# Patient Record
Sex: Male | Born: 1985 | Race: Black or African American | Hispanic: No | Marital: Single | Smoking: Never smoker
Health system: Southern US, Community
[De-identification: ages and names within clinical notes are randomized; demographics above are authoritative.]

---

## 2013-07-10 ENCOUNTER — Emergency Department (HOSPITAL_COMMUNITY): Payer: Self-pay

## 2013-07-10 ENCOUNTER — Emergency Department (HOSPITAL_COMMUNITY)
Admission: EM | Admit: 2013-07-10 | Discharge: 2013-07-10 | Disposition: A | Discharge status: Home / Self Care | Payer: Self-pay | Attending: Emergency Medicine | Admitting: Emergency Medicine

## 2013-07-10 ENCOUNTER — Encounter (HOSPITAL_COMMUNITY): Payer: Self-pay | Admitting: Emergency Medicine

## 2013-07-10 DIAGNOSIS — R9431 Abnormal electrocardiogram [ECG] [EKG]: Secondary | ICD-10-CM | POA: Insufficient documentation

## 2013-07-10 DIAGNOSIS — R0789 Other chest pain: Secondary | ICD-10-CM | POA: Insufficient documentation

## 2013-07-10 LAB — CBC
HCT: 45.4 % (ref 39.0–52.0)
HEMOGLOBIN: 16.5 g/dL (ref 13.0–17.0)
MCH: 29 pg (ref 26.0–34.0)
MCHC: 36.3 g/dL — AB (ref 30.0–36.0)
MCV: 79.8 fL (ref 78.0–100.0)
Platelets: 214 10*3/uL (ref 150–400)
RBC: 5.69 MIL/uL (ref 4.22–5.81)
RDW: 12.8 % (ref 11.5–15.5)
WBC: 10.5 10*3/uL (ref 4.0–10.5)

## 2013-07-10 LAB — BASIC METABOLIC PANEL
BUN: 13 mg/dL (ref 6–23)
CO2: 20 mEq/L (ref 19–32)
Calcium: 9.8 mg/dL (ref 8.4–10.5)
Chloride: 101 mEq/L (ref 96–112)
Creatinine, Ser: 1.16 mg/dL (ref 0.50–1.35)
GFR, EST NON AFRICAN AMERICAN: 86 mL/min — AB (ref >90)
Glucose, Bld: 101 mg/dL — ABNORMAL HIGH (ref 70–99)
POTASSIUM: 3.6 meq/L — AB (ref 3.7–5.3)
SODIUM: 141 meq/L (ref 137–147)

## 2013-07-10 LAB — I-STAT TROPONIN, ED: TROPONIN I, POC: 0.01 ng/mL (ref 0.00–0.08)

## 2013-07-10 LAB — D-DIMER, QUANTITATIVE (NOT AT ARMC)

## 2013-07-10 MED ORDER — ASPIRIN 81 MG PO CHEW
324.0000 mg | CHEWABLE_TABLET | Freq: Once | ORAL | Status: DC
  Filled 2013-07-10: qty 4

## 2013-07-10 NOTE — ED Provider Notes (Signed)
CSN: [id number]     Arrival date & time 07/10/13  1804 History   First MD Initiated Contact with Patient 07/10/13 2119     Chief Complaint  Patient presents with  . Chest Pain   HPI  History provided by the patient. Patient is a 28 year old male with no significant PMH presenting with episode of upper chest tightness. Patient states he began having some upper left-sided chest tightness earlier at work. He especially felt tightness when he took a deep breath. His symptoms were lasting for approximately one hour before they spontaneously resolved. He was not performing any strenuous activity at the time. He does report being very hot and sweating a lot due to the work conditions. Patient was a previous smoker but states he has quit recently. No past history of asthma or other lung conditions. He denies having any associated cough or shortness of breath. Pain did not radiate. He was not having any nausea. He denies any heartburn symptoms or belching. Denies having similar symptoms previously. He has no other significant medical history. No significant family history for early cardiac death or heart disease.   History reviewed. No pertinent past medical history. No past surgical history on file. No family history on file. History  Substance Use Topics  . Smoking status: Not on file  . Smokeless tobacco: Not on file  . Alcohol Use: Not on file    Review of Systems  Constitutional: Negative for fever and chills.  Respiratory: Negative for cough and shortness of breath.   Cardiovascular: Positive for chest pain. Negative for palpitations.  Gastrointestinal: Negative for nausea, vomiting and abdominal pain.  All other systems reviewed and are negative.     Allergies  Review of patient's allergies indicates no known allergies.  Home Medications  No current outpatient prescriptions on file. BP 151/79  Pulse 84  Temp(Src) 99.6 F (37.6 C) (Oral)  Resp 16  SpO2 99% Physical Exam   Nursing note and vitals reviewed. Constitutional: He appears well-developed and well-nourished. No distress.  HENT:  Head: Normocephalic and atraumatic.  Mouth/Throat: Oropharynx is clear and moist.  Neck: Normal range of motion. Neck supple.  Cardiovascular: Normal rate and regular rhythm.   Pulmonary/Chest: Effort normal and breath sounds normal. No respiratory distress. He has no wheezes. He has no rales. He exhibits no tenderness.  Abdominal: Soft. There is no tenderness. There is no rebound and no guarding.  Musculoskeletal: Normal range of motion. He exhibits no edema and no tenderness.  Skin: Skin is warm. No erythema.  Psychiatric: His behavior is normal.    ED Course  Procedures   COORDINATION OF CARE:  Nursing notes reviewed. Vital signs reviewed. Initial pt interview and examination performed.   Filed Vitals:   07/10/13 1841 07/10/13 2110  BP: 136/84 151/79  Pulse: 86 84  Temp: 99.6 F (37.6 C)   TempSrc: Oral   Resp: 14 16  SpO2: 100% 99%    9:20 PM-patient seen and evaluated. Patient resting appears well no acute distress. Denies any pain or discomforts at this time.  10:15 PM patient and EKGs discussed and reviewed with attending physician, Dr. Jodi Mourning. Repeat EKG continues to show some high voltage probably related to patient's age and body this less likely to hypertrophy. There is slight repolarization abnormality and ST changes likely related to age. Patient without any significant risk factors for ACS. Lab testing and troponin unremarkable. At this time we will plan to refer patient to cardiology. He agrees with this plan.  Results for orders placed during the hospital encounter of 07/10/13  CBC      Result Value Ref Range   WBC 10.5  4.0 - 10.5 K/uL   RBC 5.69  4.22 - 5.81 MIL/uL   Hemoglobin 16.5  13.0 - 17.0 g/dL   HCT 16.145.4  09.639.0 - 04.552.0 %   MCV 79.8  78.0 - 100.0 fL   MCH 29.0  26.0 - 34.0 pg   MCHC 36.3 (*) 30.0 - 36.0 g/dL   RDW 40.912.8  81.111.5 -  91.415.5 %   Platelets 214  150 - 400 K/uL  BASIC METABOLIC PANEL      Result Value Ref Range   Sodium 141  137 - 147 mEq/L   Potassium 3.6 (*) 3.7 - 5.3 mEq/L   Chloride 101  96 - 112 mEq/L   CO2 20  19 - 32 mEq/L   Glucose, Bld 101 (*) 70 - 99 mg/dL   BUN 13  6 - 23 mg/dL   Creatinine, Ser 7.821.16  0.50 - 1.35 mg/dL   Calcium 9.8  8.4 - 95.610.5 mg/dL   GFR calc non Af Amer 86 (*) >90 mL/min   GFR calc Af Amer >90  >90 mL/min  D-DIMER, QUANTITATIVE      Result Value Ref Range   D-Dimer, Quant <0.27  0.00 - 0.48 ug/mL-FEU  I-STAT TROPOININ, ED      Result Value Ref Range   Troponin i, poc 0.01  0.00 - 0.08 ng/mL   Comment 3                   Imaging Review Dg Chest 2 View  07/10/2013   CLINICAL DATA:  Chest tightness  EXAM: CHEST  2 VIEW  COMPARISON:  None.  FINDINGS: The heart size and mediastinal contours are within normal limits. Both lungs are clear. The visualized skeletal structures are unremarkable.  IMPRESSION: No active cardiopulmonary disease.   Electronically Signed   By: Esperanza Heiraymond  Rubner M.D.   On: 07/10/2013 21:42     EKG Interpretation None      Date: 07/10/2013 18:35  Rate: 84  Rhythm: normal sinus rhythm and sinus arrhythmia  QRS Axis: normal  Intervals: QT prolonged  ST/T Wave abnormalities: nonspecific ST/T changes  Conduction Disutrbances:none  Narrative Interpretation: Possible LVH  Old EKG Reviewed: none available   Date: 07/10/2013  Rate: 81  Rhythm: normal sinus rhythm  QRS Axis: normal  Intervals: normal  ST/T Wave abnormalities: nonspecific ST/T changes  Conduction Disutrbances:none  Narrative Interpretation: Possible LVH  Old EKG Reviewed: No significant change        MDM   Final diagnoses:  Atypical chest pain  Abnormal EKG        Angus Sellereter S Jeryn Bertoni, PA-C 07/10/13 2341

## 2013-07-10 NOTE — Discharge Instructions (Signed)
Your testing and x-rays today did not show any signs for any emergent condition to explain your symptoms.  Your EKG of your heart did show some slight abnormalities.  Please follow up with a cardiology specialist for continued evaluation and treatment.     Chest Pain (Nonspecific) Chest pain has many causes. Your pain could be caused by something serious, such as a heart attack or a blood clot in the lungs. It could also be caused by something less serious, such as a chest bruise or a virus. Follow up with your doctor. More lab tests or other studies may be needed to find the cause of your pain. Most of the time, nonspecific chest pain will improve within 2 to 3 days of rest and mild pain medicine. HOME CARE  For chest bruises, you may put ice on the sore area for 15-20 minutes, 03-04 times a day. Do this only if it makes you feel better.  Put ice in a plastic bag.  Place a towel between the skin and the bag.  Rest for the next 2 to 3 days.  Go back to work if the pain improves.  See your doctor if the pain lasts longer than 1 to 2 weeks.  Only take medicine as told by your doctor.  Quit smoking if you smoke. GET HELP RIGHT AWAY IF:   There is more pain or pain that spreads to the arm, neck, jaw, back, or belly (abdomen).  You have shortness of breath.  You cough more than usual or cough up blood.  You have very bad back or belly pain, feel sick to your stomach (nauseous), or throw up (vomit).  You have very bad weakness.  You pass out (faint).  You have a fever. Any of these problems may be serious and may be an emergency. Do not wait to see if the problems will go away. Get medical help right away. Call your local emergency services 911 in [place] Do not drive yourself to the hospital. MAKE SURE YOU:   Understand these instructions.  Will watch this condition.  Will get help right away if you or your child is not doing well or gets worse. Document Released: 09/05/2007  Document Revised: 06/11/2011 Document Reviewed: 09/05/2007 [facility] Patient Information 2014 [place], [place].

## 2013-07-10 NOTE — ED Notes (Signed)
Discharge and follow up reviewed. Pt verbalized understanding.  

## 2013-07-10 NOTE — ED Notes (Signed)
Pt in c/o chest pain that started while working outside in the heat, states he had a tight feeling while taking a deep breath, symptoms resolved, denies pain at this time, no distress noted

## 2013-07-11 NOTE — ED Provider Notes (Signed)
Medical screening examination/treatment/procedure(s) were performed by non-physician practitioner and as supervising physician I was immediately available for consultation/collaboration.   EKG Interpretation None        Enid SkeensJoshua M Kylyn Mcdade, MD 07/11/13 [phone]

## 2014-12-18 IMAGING — CR DG CHEST 2V
2 series · 2 of 2 positions shown · non-contrast
Comparison: None.

CLINICAL DATA: Chest tightness

EXAM:
CHEST  2 VIEW

[w chest pa]
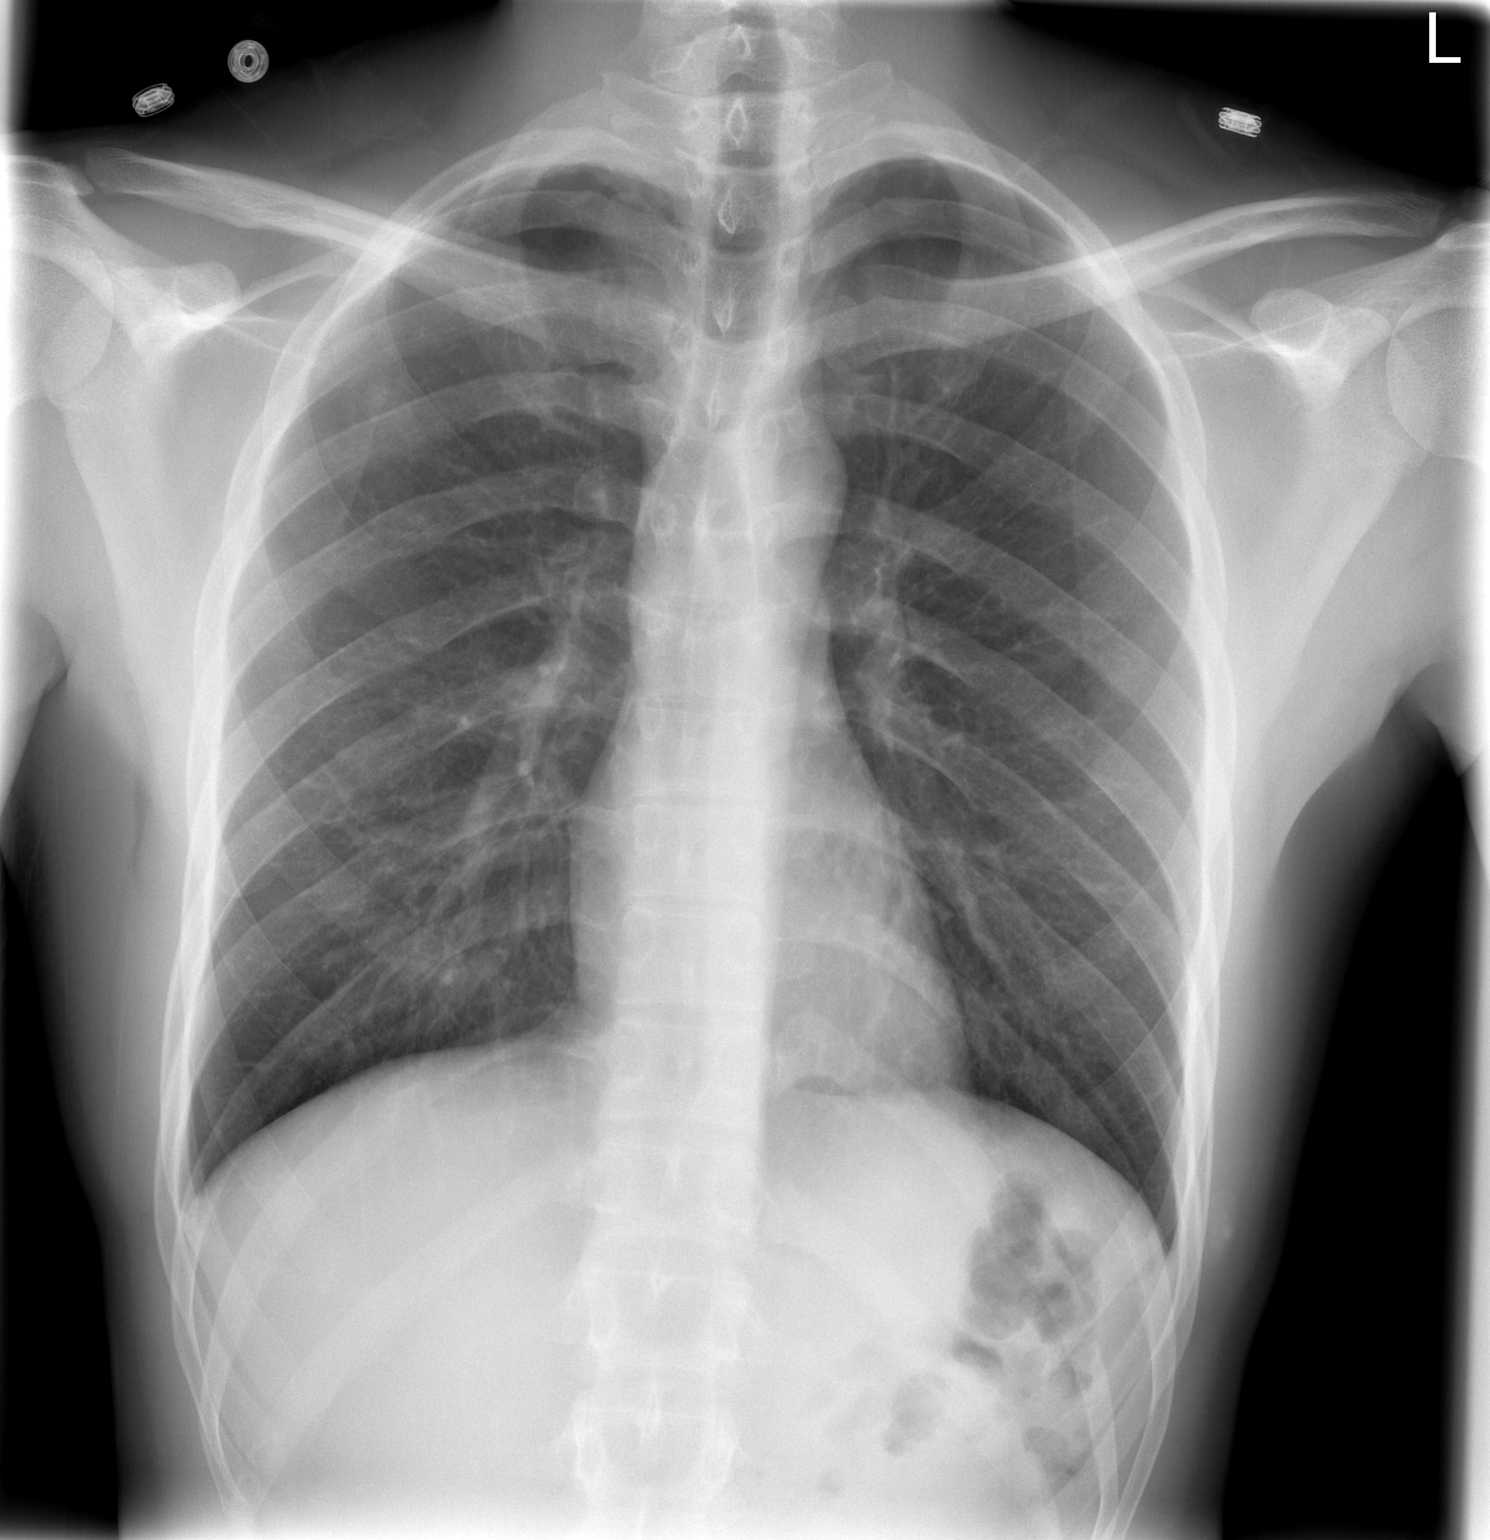

[w chest lat]
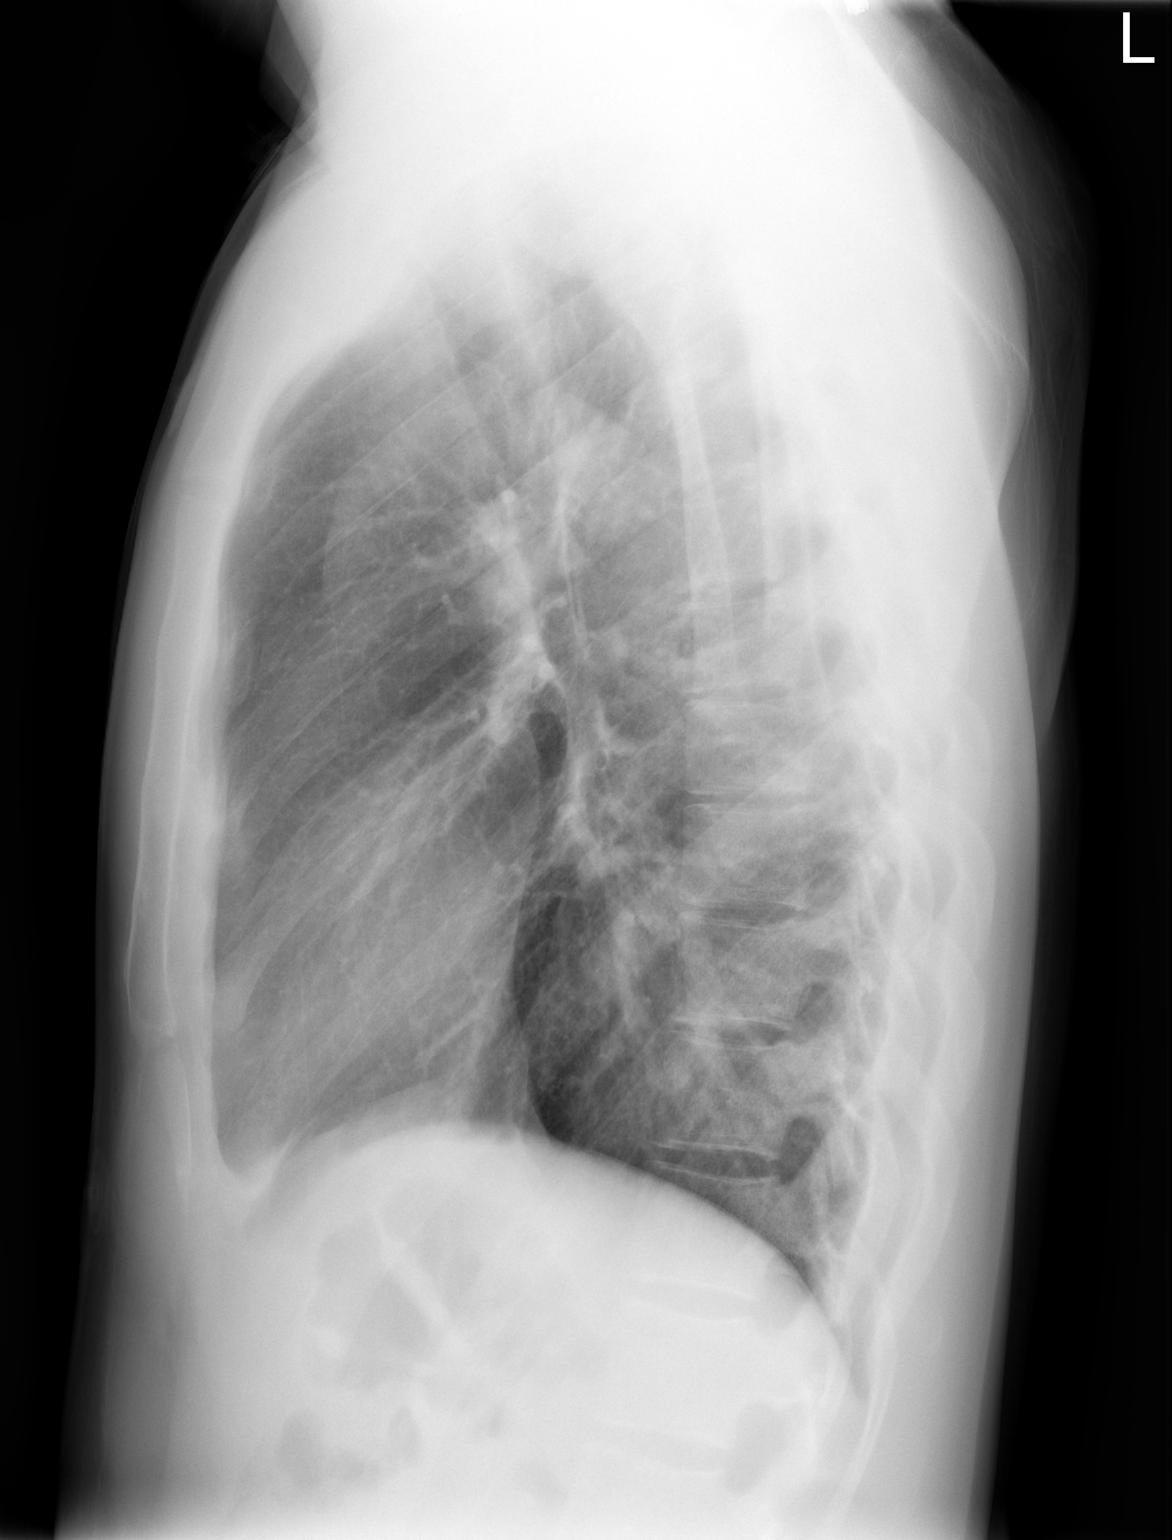

[2 of 2 positions shown; findings below may reference images not displayed]

FINDINGS: The heart size and mediastinal contours are within normal limits.
Both lungs are clear. The visualized skeletal structures are
unremarkable.
IMPRESSION: No active cardiopulmonary disease.

## 2015-02-11 ENCOUNTER — Ambulatory Visit (INDEPENDENT_AMBULATORY_CARE_PROVIDER_SITE_OTHER): Payer: Self-pay | Admitting: Family Medicine

## 2015-02-11 VITALS — BP 120/78 | HR 72 | Temp 98.1°F | Resp 18 | Ht 73.0 in | Wt 155.6 lb

## 2015-02-11 DIAGNOSIS — R059 Cough, unspecified: Secondary | ICD-10-CM

## 2015-02-11 DIAGNOSIS — R0600 Dyspnea, unspecified: Secondary | ICD-10-CM

## 2015-02-11 DIAGNOSIS — R05 Cough: Secondary | ICD-10-CM

## 2015-02-11 MED ORDER — HYDROXYZINE HCL 10 MG PO TABS: 10.0000 mg | ORAL_TABLET | Freq: Two times a day (BID) | ORAL | Status: AC | PRN

## 2015-02-11 NOTE — Progress Notes (Signed)
 @[facility]CLOGO @  This chart was scribed for Elvina SidleKurt Atreyu Mak, MD by Andrew Auaven Small, ED Scribe. This patient was seen in room 1 and the patient's care was started at 4:30 PM.  Patient ID: Shawn Kramer MRN: [id number], DOB: 13-Nov-1985, 29 y.o. Date of Encounter: 02/11/2015, 4:27 PM  Primary Physician: No PCP Per Patient  Chief Complaint:  Chief Complaint  Patient presents with  . Shortness of Breath    yesterday    HPI: 29 y.o. year old male with history below presents with SOB for 1 day. Pt became SOB yesterday after leaving the barbershop. He slept fine last night but woke up this morning very mild SOB. He is a Production designer, theatre/television/filmmanager at [employer] and states SOB became more frequent while at work. Reports SOB is sporadic and last for a couple seconds  He also has had a a mild cough for 4 days,  slight HA as well as hamstring tightness and groin pain. He work 50-60 hours a week and denies feeling stressed. He was seen in the hospital 07/2013 for CP but symptoms are not similar.  No hx of asthma. He is a marijuana smoker.  History reviewed. No pertinent past medical history.   Home Meds: Prior to Admission medications   Medication Sig Start Date End Date Taking? Authorizing Provider  Aspirin-Salicylamide-Caffeine (BC HEADACHE POWDER PO) Take 1 packet by mouth daily as needed (for headache adn pain).   Yes Historical Provider, MD  ibuprofen (ADVIL,MOTRIN) 200 MG tablet Take 400 mg by mouth every 6 (six) hours as needed for headache or moderate pain.    Historical Provider, MD    Allergies: No Known Allergies  Social History   Social History  . Marital Status: Single    Spouse Name: N/A  . Number of Children: N/A  . Years of Education: N/A   Occupational History  . Not on file.   Social History Main Topics  . Smoking status: Never Smoker   . Smokeless tobacco: Not on file  . Alcohol Use: 1.2 oz/week    2 Standard drinks or equivalent per week  . Drug Use: 2.00 per week    Special: Marijuana  .  Sexual Activity: Not on file   Other Topics Concern  . Not on file   Social History Narrative     Review of Systems: Constitutional: negative for chills, fever, night sweats, weight changes, or fatigue  HEENT: negative for vision changes, hearing loss, congestion, rhinorrhea, ST, epistaxis, or sinus pressure Cardiovascular: negative for chest pain or palpitations Respiratory: negative for hemoptysis, wheezing or cough Abdominal: negative for abdominal pain, nausea, vomiting, diarrhea, or constipation Dermatological: negative for rash Neurologic: negative for dizziness, or syncope All other systems reviewed and are otherwise negative with the exception to those above and in the HPI.   Physical Exam: Blood pressure 120/78, pulse 72, temperature 98.1 F (36.7 C), temperature source Oral, resp. rate 18, height 6\' 1"  (1.854 m), weight 155 lb 9.6 oz (70.58 kg), SpO2 98 %., Body mass index is 20.53 kg/(m^2). General: Well developed, well nourished, in no acute distress. Head: Normocephalic, atraumatic, eyes without discharge, sclera non-icteric, nares are without discharge.  Neck: Supple. No thyromegaly. Full ROM. No lymphadenopathy. Lungs: Clear bilaterally to auscultation without wheezes, rales, or rhonchi. Breathing is unlabored. Heart: RRR with S1 S2. No murmurs, rubs, or gallops appreciated. Abdomen: Soft, non-tender, non-distended with normoactive bowel sounds. No hepatomegaly. No rebound/guarding. No obvious abdominal masses. Msk:  Strength and tone normal for age. Extremities/Skin: Warm and dry.  No clubbing or cyanosis. No edema. No rashes or suspicious lesions. Neuro: Alert and oriented X 3. Moves all extremities spontaneously. Gait is normal. CNII-XII grossly in tact. Psych:  Responds to questions appropriately with a normal affect.   ASSESSMENT AND PLAN:  29 y.o. year old male with dyspnea intermittently but no red flags.  Some of this may be reactive airways, some stress and  anxiety    ICD-9-CM ICD-10-CM   1. Cough 786.2 R05 hydrOXYzine (ATARAX/VISTARIL) 10 MG tablet  2. Dyspnea 786.09 R06.00 hydrOXYzine (ATARAX/VISTARIL) 10 MG tablet   -RTC if symptoms persist or worsen This chart was scribed in my presence and reviewed by me personally.   Signed, Elvina Sidle, MD 02/11/2015 4:27 PM

## 2015-02-11 NOTE — Patient Instructions (Signed)
I believe that you initially had a bronchitis that has inflamed the airways.  The atarax (hydroxyzine) should reduce this and end the shortness of breath over the next several days.
